# Patient Record
Sex: Male | Born: 1990 | Hispanic: Yes | Marital: Single | Smoking: Current every day smoker
Health system: Southern US, Community
[De-identification: ages and names within clinical notes are randomized; demographics above are authoritative.]

## PROBLEM LIST (undated history)

## (undated) DIAGNOSIS — F209 Schizophrenia, unspecified: Secondary | ICD-10-CM

---
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
## 2011-04-05 LAB — DIFFERENTIAL
Eosinophils Relative: 2
Lymphocytes Relative: 35
Lymphs Abs: 3
Monocytes Absolute: 0.6

## 2011-04-05 LAB — ETHANOL

## 2011-04-05 LAB — TRICYCLICS SCREEN, URINE

## 2011-04-05 LAB — URINALYSIS, ROUTINE W REFLEX MICROSCOPIC
Hgb urine dipstick: NEGATIVE
Ketones, ur: 15 — AB
Protein, ur: NEGATIVE
Urobilinogen, UA: 0.2

## 2011-04-05 LAB — URINE CULTURE
Colony Count: NO GROWTH
Culture: NO GROWTH

## 2011-04-05 LAB — CBC
HCT: 44.6 — ABNORMAL HIGH
Hemoglobin: 15.4 — ABNORMAL HIGH
RBC: 5.3 — ABNORMAL HIGH
WBC: 8.5

## 2011-04-05 LAB — POCT I-STAT, CHEM 8
Creatinine, Ser: 1
HCT: 47 — ABNORMAL HIGH
Hemoglobin: 16 — ABNORMAL HIGH
Potassium: 3.9
Sodium: 140
TCO2: 26

## 2011-04-05 LAB — RAPID URINE DRUG SCREEN, HOSP PERFORMED
Opiates: NOT DETECTED
Tetrahydrocannabinol: POSITIVE — AB

## 2011-04-12 LAB — COMPREHENSIVE METABOLIC PANEL
ALT: 10 U/L (ref 0–53)
AST: 11 U/L (ref 0–37)
Albumin: 4.1 g/dL (ref 3.5–5.2)
Alkaline Phosphatase: 133 U/L (ref 52–171)
BUN: 10 mg/dL (ref 6–23)
CO2: 29 mEq/L (ref 19–32)
Chloride: 104 mEq/L (ref 96–112)
Chloride: 106 mEq/L (ref 96–112)
Creatinine, Ser: 1 mg/dL (ref 0.4–1.5)
Glucose, Bld: 86 mg/dL (ref 70–99)
Potassium: 3.7 mEq/L (ref 3.5–5.1)
Potassium: 3.9 mEq/L (ref 3.5–5.1)
Sodium: 140 mEq/L (ref 135–145)
Sodium: 141 mEq/L (ref 135–145)
Total Bilirubin: 0.6 mg/dL (ref 0.3–1.2)
Total Bilirubin: 0.8 mg/dL (ref 0.3–1.2)

## 2011-04-12 LAB — URINALYSIS, MICROSCOPIC ONLY
Bilirubin Urine: NEGATIVE
Glucose, UA: NEGATIVE mg/dL
Hgb urine dipstick: NEGATIVE
Ketones, ur: NEGATIVE mg/dL
Protein, ur: NEGATIVE mg/dL

## 2011-04-12 LAB — LIPID PANEL
Cholesterol: 142 mg/dL (ref 0–169)
HDL: 45 mg/dL (ref >34)
Total CHOL/HDL Ratio: 3.2 RATIO

## 2011-04-12 LAB — HEPATIC FUNCTION PANEL
ALT: 9 U/L (ref 0–53)
Albumin: 4.1 g/dL (ref 3.5–5.2)
Alkaline Phosphatase: 133 U/L (ref 52–171)
Total Bilirubin: 0.5 mg/dL (ref 0.3–1.2)
Total Protein: 7.2 g/dL (ref 6.0–8.3)

## 2011-04-12 LAB — DRUGS OF ABUSE SCREEN W/O ALC, ROUTINE URINE
Barbiturate Quant, Ur: NEGATIVE
Cocaine Metabolites: NEGATIVE
Methadone: NEGATIVE
Phencyclidine (PCP): NEGATIVE

## 2011-04-12 LAB — T4, FREE: Free T4: 1.06 ng/dL (ref 0.89–1.80)

## 2011-04-12 LAB — DIFFERENTIAL
Basophils Relative: 0 % (ref 0–1)
Eosinophils Absolute: 0.2 10*3/uL (ref 0.0–1.2)
Eosinophils Relative: 2 % (ref 0–5)
Neutrophils Relative %: 73 % — ABNORMAL HIGH (ref 43–71)

## 2011-04-12 LAB — CBC
Hemoglobin: 15.4 g/dL (ref 12.0–16.0)
MCHC: 33.3 g/dL (ref 31.0–37.0)
RBC: 5.37 MIL/uL (ref 3.80–5.70)
WBC: 7.8 10*3/uL (ref 4.5–13.5)

## 2011-04-12 LAB — GC/CHLAMYDIA PROBE AMP, URINE: Chlamydia, Swab/Urine, PCR: NEGATIVE

## 2011-04-12 LAB — GAMMA GT: GGT: 16 U/L (ref 7–51)

## 2011-04-15 LAB — COMPREHENSIVE METABOLIC PANEL
ALT: 9 U/L (ref 0–53)
AST: 11 U/L (ref 0–37)
Albumin: 3.7 g/dL (ref 3.5–5.2)
CO2: 24 mEq/L (ref 19–32)
Calcium: 9.4 mg/dL (ref 8.4–10.5)
Sodium: 138 mEq/L (ref 135–145)
Total Protein: 6.4 g/dL (ref 6.0–8.3)

## 2011-04-15 LAB — URINALYSIS, ROUTINE W REFLEX MICROSCOPIC
Bilirubin Urine: NEGATIVE
Glucose, UA: NEGATIVE mg/dL
Ketones, ur: NEGATIVE mg/dL
pH: 6 (ref 5.0–8.0)

## 2011-04-15 LAB — DIFFERENTIAL
Eosinophils Absolute: 0.2 10*3/uL (ref 0.0–1.2)
Eosinophils Relative: 3 % (ref 0–5)
Lymphs Abs: 2.1 10*3/uL (ref 1.1–4.8)
Monocytes Absolute: 0.5 10*3/uL (ref 0.2–1.2)
Monocytes Relative: 8 % (ref 3–11)

## 2011-04-15 LAB — CBC
MCHC: 33.9 g/dL (ref 31.0–37.0)
RBC: 4.9 MIL/uL (ref 3.80–5.70)
RDW: 13.6 % (ref 11.4–15.5)

## 2014-09-09 ENCOUNTER — Emergency Department (HOSPITAL_COMMUNITY)
Admission: EM | Admit: 2014-09-09 | Discharge: 2014-09-09 | Disposition: A | Discharge status: Home / Self Care | Payer: Medicaid Other | Source: Home / Self Care | Attending: Emergency Medicine | Admitting: Emergency Medicine

## 2014-09-09 ENCOUNTER — Encounter (HOSPITAL_COMMUNITY): Payer: Self-pay | Admitting: *Deleted

## 2014-09-09 ENCOUNTER — Emergency Department (HOSPITAL_COMMUNITY)
Admission: EM | Admit: 2014-09-09 | Discharge: 2014-09-09 | Disposition: A | Discharge status: Home / Self Care | Payer: Medicaid Other | Attending: Emergency Medicine | Admitting: Emergency Medicine

## 2014-09-09 DIAGNOSIS — Z720 Tobacco use: Secondary | ICD-10-CM | POA: Insufficient documentation

## 2014-09-09 DIAGNOSIS — H6091 Unspecified otitis externa, right ear: Secondary | ICD-10-CM

## 2014-09-09 DIAGNOSIS — H6691 Otitis media, unspecified, right ear: Secondary | ICD-10-CM | POA: Insufficient documentation

## 2014-09-09 DIAGNOSIS — F209 Schizophrenia, unspecified: Secondary | ICD-10-CM | POA: Insufficient documentation

## 2014-09-09 DIAGNOSIS — Z79899 Other long term (current) drug therapy: Secondary | ICD-10-CM

## 2014-09-09 DIAGNOSIS — H6121 Impacted cerumen, right ear: Secondary | ICD-10-CM | POA: Diagnosis not present

## 2014-09-09 DIAGNOSIS — H9201 Otalgia, right ear: Secondary | ICD-10-CM | POA: Diagnosis present

## 2014-09-09 HISTORY — DX: Schizophrenia, unspecified: F20.9

## 2014-09-09 MED ORDER — NAPROXEN 500 MG PO TABS: 500.0000 mg | ORAL_TABLET | Freq: Two times a day (BID) | ORAL | Status: AC

## 2014-09-09 MED ORDER — AMOXICILLIN 500 MG PO CAPS: 500.0000 mg | ORAL_CAPSULE | Freq: Three times a day (TID) | ORAL | Status: DC

## 2014-09-09 MED ORDER — NEOMYCIN-POLYMYXIN-HC 3.5-10000-1 OT SUSP: 4.0000 [drp] | Freq: Three times a day (TID) | OTIC | Status: AC

## 2014-09-09 MED ORDER — TRAMADOL HCL 50 MG PO TABS: 50.0000 mg | ORAL_TABLET | Freq: Four times a day (QID) | ORAL | Status: AC | PRN

## 2014-09-09 NOTE — ED Notes (Signed)
Right ear irrigated with relief

## 2014-09-09 NOTE — ED Notes (Signed)
Pt reports seen last night for same, had wax removed. States went home and pain got worse in right ear.

## 2014-09-09 NOTE — ED Notes (Signed)
Pt reports R ear pain and drainage since Saturday.

## 2014-09-09 NOTE — Discharge Instructions (Signed)
Otitis Media Otitis media is redness, soreness, and inflammation of the middle ear. Otitis media may be caused by allergies or, most commonly, by infection. Often it occurs as a complication of the common cold. SIGNS AND SYMPTOMS Symptoms of otitis media may include:  Earache.  Fever.  Ringing in your ear.  Headache.  Leakage of fluid from the ear. DIAGNOSIS To diagnose otitis media, your health care provider will examine your ear with an otoscope. This is an instrument that allows your health care provider to see into your ear in order to examine your eardrum. Your health care provider also will ask you questions about your symptoms. TREATMENT  Typically, otitis media resolves on its own within 3-5 days. Your health care provider may prescribe medicine to ease your symptoms of pain. If otitis media does not resolve within 5 days or is recurrent, your health care provider may prescribe antibiotic medicines if he or she suspects that a bacterial infection is the cause. HOME CARE INSTRUCTIONS   If you were prescribed an antibiotic medicine, finish it all even if you start to feel better.  Take medicines only as directed by your health care provider.  Keep all follow-up visits as directed by your health care provider. SEEK MEDICAL CARE IF:  You have otitis media only in one ear, or bleeding from your nose, or both.  You notice a lump on your neck.  You are not getting better in 3-5 days.  You feel worse instead of better. SEEK IMMEDIATE MEDICAL CARE IF:   You have pain that is not controlled with medicine.  You have swelling, redness, or pain around your ear or stiffness in your neck.  You notice that part of your face is paralyzed.  You notice that the bone behind your ear (mastoid) is tender when you touch it. MAKE SURE YOU:   Understand these instructions.  Will watch your condition.  Will get help right away if you are not doing well or get worse. Document Released:  04/01/2004 Document Revised: 11/11/2013 Document Reviewed: 01/22/2013 Exit[employer] Patient Information 2015 [employer]. This information is not intended to replace advice given to you by your health care provider. Make sure you discuss any questions you have with your health care provider. Otitis Externa Otitis externa is a bacterial or fungal infection of the outer ear canal. This is the area from the eardrum to the outside of the ear. Otitis externa is sometimes called "swimmer's ear." CAUSES  Possible causes of infection include:  Swimming in dirty water.  Moisture remaining in the ear after swimming or bathing.  Mild injury (trauma) to the ear.  Objects stuck in the ear (foreign body).  Cuts or scrapes (abrasions) on the outside of the ear. SIGNS AND SYMPTOMS  The first symptom of infection is often itching in the ear canal. Later signs and symptoms may include swelling and redness of the ear canal, ear pain, and yellowish-white fluid (pus) coming from the ear. The ear pain may be worse when pulling on the earlobe. DIAGNOSIS  Your health care provider will perform a physical exam. A sample of fluid may be taken from the ear and examined for bacteria or fungi. TREATMENT  Antibiotic ear drops are often given for 10 to 14 days. Treatment may also include pain medicine or corticosteroids to reduce itching and swelling. HOME CARE INSTRUCTIONS   Apply antibiotic ear drops to the ear canal as prescribed by your health care provider.  Take medicines only as directed by   your health care provider.  If you have diabetes, follow any additional treatment instructions from your health care provider.  Keep all follow-up visits as directed by your health care provider. PREVENTION   Keep your ear dry. Use the corner of a towel to absorb water out of the ear canal after swimming or bathing.  Avoid scratching or putting objects inside your ear. This can damage the ear canal or remove the  protective wax that lines the canal. This makes it easier for bacteria and fungi to grow.  Avoid swimming in lakes, polluted water, or poorly chlorinated pools.  You may use ear drops made of rubbing alcohol and vinegar after swimming. Combine equal parts of white vinegar and alcohol in a bottle. Put 3 or 4 drops into each ear after swimming. SEEK MEDICAL CARE IF:   You have a fever.  Your ear is still red, swollen, painful, or draining pus after 3 days.  Your redness, swelling, or pain gets worse.  You have a severe headache.  You have redness, swelling, pain, or tenderness in the area behind your ear. MAKE SURE YOU:   Understand these instructions.  Will watch your condition.  Will get help right away if you are not doing well or get worse. Document Released: 06/27/2005 Document Revised: 11/11/2013 Document Reviewed: 07/14/2011 Exit[employer] Patient Information 2015 [employer]. This information is not intended to replace advice given to you by your health care provider. Make sure you discuss any questions you have with your health care provider.  

## 2014-09-09 NOTE — Discharge Instructions (Signed)
Cerumen Impaction °A cerumen impaction is when the wax in your ear forms a plug. This plug usually causes reduced hearing. Sometimes it also causes an earache or dizziness. Removing a cerumen impaction can be difficult and painful. The wax sticks to the ear canal. The canal is sensitive and bleeds easily. If you try to remove a heavy wax buildup with a cotton tipped swab, you may push it in further. °Irrigation with water, suction, and small ear curettes may be used to clear out the wax. If the impaction is fixed to the skin in the ear canal, ear drops may be needed for a few days to loosen the wax. People who build up a lot of wax frequently can use ear wax removal products available in your local drugstore. °SEEK MEDICAL CARE IF:  °You develop an earache, increased hearing loss, or marked dizziness. °Document Released: 08/04/2004 Document Revised: 09/19/2011 Document Reviewed: 09/24/2009 °Exit[employer]® Patient Information ©2015 [employer]. This information is not intended to replace advice given to you by your health care provider. Make sure you discuss any questions you have with your health care provider. ° °

## 2014-09-09 NOTE — ED Provider Notes (Signed)
CSN: [id number]     Arrival date & time 09/09/14  0127 History   First MD Initiated Contact with Patient 09/09/14 [id number]     Chief Complaint  Patient presents with  . Otalgia     (Consider location/radiation/quality/duration/timing/severity/associated sxs/prior Treatment) Patient is a 24 y.o. male presenting with ear pain. The history is provided by the patient. No language interpreter was used.  Otalgia Location:  Right Associated symptoms: ear discharge and hearing loss   Associated symptoms: no congestion and no fever     Past Medical History  Diagnosis Date  . Schizophrenia    History reviewed. No pertinent past surgical history. No family history on file. History  Substance Use Topics  . Smoking status: Current Every Day Smoker -- 0.25 packs/day    Types: Cigarettes  . Smokeless tobacco: Not on file  . Alcohol Use: No    Review of Systems  Constitutional: Negative for fever.  HENT: Positive for ear discharge, ear pain and hearing loss. Negative for congestion.   Gastrointestinal: Negative for nausea.  Neurological: Negative for dizziness.      Allergies  Review of patient's allergies indicates no known allergies.  Home Medications   Prior to Admission medications   Medication Sig Start Date End Date Taking? Authorizing Provider  escitalopram (LEXAPRO) 10 MG tablet Take 10 mg by mouth daily.   Yes Historical Provider, MD  QUEtiapine (SEROQUEL) 100 MG tablet Take 100 mg by mouth at bedtime.   Yes Historical Provider, MD  QUEtiapine (SEROQUEL) 200 MG tablet Take 200 mg by mouth at bedtime.   Yes Historical Provider, MD  risperiDONE (RISPERDAL) 2 MG tablet Take 2 mg by mouth 2 (two) times daily.   Yes Historical Provider, MD   BP 142/69 mmHg  Pulse 101  Temp(Src) 98.2 F (36.8 C) (Oral)  Resp 18  Ht 5\' 5"  (1.651 m)  Wt 145 lb (65.772 kg)  BMI 24.13 kg/m2  SpO2 100% Physical Exam  Constitutional: He is oriented to person, place, and time. He appears  well-developed and well-nourished.  HENT:  Right external canal impacted with cerumen. No bleeding or purulent drainage.   Neck: Normal range of motion.  Pulmonary/Chest: Effort normal.  Musculoskeletal: Normal range of motion.  Neurological: He is alert and oriented to person, place, and time.  Skin: Skin is warm and dry.  Psychiatric: He has a normal mood and affect.    ED Course  Procedures (including critical care time) Labs Review Labs Reviewed - No data to display  Imaging Review No results found.   EKG Interpretation None      MDM   Final diagnoses:  None    1. Cerumen impaction, resolved  Easy clearing of impaction with lavage.     Arnoldo HookerShari A Fillmore Bynum, PA-C 09/09/14 0422  Tomasita CrumbleAdeleke Oni, MD 09/09/14 [phone]

## 2014-09-09 NOTE — ED Provider Notes (Signed)
TIME SEEN: 9:35 AM  CHIEF COMPLAINT: Right ear pain  HPI: Pt is a 24 y.o. male with history of schizophrenia who presents emergency department with right ear pain. Was seen last night and had cerumen impaction which was removed with lavage. States his pain progressively worsened. Denies any fevers. No hearing loss. No tinnitus. No ear drainage.  ROS: See HPI Constitutional: no fever  Eyes: no drainage  ENT: no runny nose   Cardiovascular:  no chest pain  Resp: no SOB  GI: no vomiting GU: no dysuria Integumentary: no rash  Allergy: no hives  Musculoskeletal: no leg swelling  Neurological: no slurred speech ROS otherwise negative  PAST MEDICAL HISTORY/PAST SURGICAL HISTORY:  Past Medical History  Diagnosis Date  . Schizophrenia     MEDICATIONS:  Prior to Admission medications   Medication Sig Start Date End Date Taking? Authorizing Provider  amoxicillin (AMOXIL) 500 MG capsule Take 1 capsule (500 mg total) by mouth 3 (three) times daily. 09/09/14   Kristen N Ward, DO  escitalopram (LEXAPRO) 10 MG tablet Take 10 mg by mouth daily.    Historical Provider, MD  naproxen (NAPROSYN) 500 MG tablet Take 1 tablet (500 mg total) by mouth 2 (two) times daily. 09/09/14   Kristen N Ward, DO  neomycin-polymyxin-hydrocortisone (CORTISPORIN) 3.5-10000-1 otic suspension Place 4 drops into the right ear 3 (three) times daily. For one week 09/09/14   Layla MawKristen N Ward, DO  QUEtiapine (SEROQUEL) 100 MG tablet Take 100 mg by mouth at bedtime.    Historical Provider, MD  QUEtiapine (SEROQUEL) 200 MG tablet Take 200 mg by mouth at bedtime.    Historical Provider, MD  risperiDONE (RISPERDAL) 2 MG tablet Take 2 mg by mouth 2 (two) times daily.    Historical Provider, MD  traMADol (ULTRAM) 50 MG tablet Take 1 tablet (50 mg total) by mouth every 6 (six) hours as needed. 09/09/14   Kristen N Ward, DO    ALLERGIES:  No Known Allergies  SOCIAL HISTORY:  History  Substance Use Topics  . Smoking status: Current  Every Day Smoker -- 0.25 packs/day    Types: Cigarettes  . Smokeless tobacco: Not on file  . Alcohol Use: No    FAMILY HISTORY: No family history on file.  EXAM: BP 115/57 mmHg  Pulse 79  Temp(Src) 98.2 F (36.8 C) (Oral)  Resp 16  Ht 5\' 5"  (1.651 m)  Wt 145 lb (65.772 kg)  BMI 24.13 kg/m2  SpO2 99% CONSTITUTIONAL: Alert and oriented and responds appropriately to questions. Well-appearing; well-nourished HEAD: Normocephalic EYES: Conjunctivae clear, PERRL ENT: normal nose; no rhinorrhea; moist mucous membranes; pharynx without lesions noted, left TM is clear without purulence or erythema, right TM appears purulent, bulging, erythematous. There is also inflammation and erythema of the canal, no impaction, no drainage, no sign of mastoiditis  NECK: Supple, no meningismus, no LAD  CARD: RRR; S1 and S2 appreciated; no murmurs, no clicks, no rubs, no gallops RESP: Normal chest excursion without splinting or tachypnea; breath sounds clear and equal bilaterally; no wheezes, no rhonchi, no rales,  ABD/GI: Normal bowel sounds; non-distended; soft, non-tender, no rebound, no guarding BACK:  The back appears normal and is non-tender to palpation, there is no CVA tenderness EXT: Normal ROM in all joints; non-tender to palpation; no edema; normal capillary refill; no cyanosis    SKIN: Normal color for age and race; warm NEURO: Moves all extremities equally PSYCH: The patient's mood and manner are appropriate. Grooming and personal hygiene are appropriate.  MEDICAL DECISION MAKING: Patient here with what appears to be otitis media and otitis externa. We'll discharge home on amoxicillin and with Corticosporin drops. We'll discharge with naproxen and tramadol as needed for pain. Discussed return precautions and need for outpatient follow-up. Patient verbalizes understanding and is comfortable with plan.     Layla Maw Ward, DO 09/09/14 [phone]

## 2015-07-27 ENCOUNTER — Encounter (HOSPITAL_COMMUNITY): Payer: Self-pay | Admitting: Emergency Medicine

## 2015-07-27 ENCOUNTER — Emergency Department (HOSPITAL_COMMUNITY)
Admission: EM | Admit: 2015-07-27 | Discharge: 2015-07-27 | Disposition: A | Discharge status: Home / Self Care | Payer: Medicaid Other | Attending: Emergency Medicine | Admitting: Emergency Medicine

## 2015-07-27 DIAGNOSIS — Z791 Long term (current) use of non-steroidal anti-inflammatories (NSAID): Secondary | ICD-10-CM | POA: Insufficient documentation

## 2015-07-27 DIAGNOSIS — S01512A Laceration without foreign body of oral cavity, initial encounter: Secondary | ICD-10-CM | POA: Diagnosis not present

## 2015-07-27 DIAGNOSIS — F17210 Nicotine dependence, cigarettes, uncomplicated: Secondary | ICD-10-CM | POA: Insufficient documentation

## 2015-07-27 DIAGNOSIS — F209 Schizophrenia, unspecified: Secondary | ICD-10-CM | POA: Insufficient documentation

## 2015-07-27 DIAGNOSIS — X58XXXA Exposure to other specified factors, initial encounter: Secondary | ICD-10-CM | POA: Insufficient documentation

## 2015-07-27 DIAGNOSIS — Y998 Other external cause status: Secondary | ICD-10-CM | POA: Insufficient documentation

## 2015-07-27 DIAGNOSIS — S0993XA Unspecified injury of face, initial encounter: Secondary | ICD-10-CM | POA: Diagnosis present

## 2015-07-27 DIAGNOSIS — Y9389 Activity, other specified: Secondary | ICD-10-CM | POA: Diagnosis not present

## 2015-07-27 DIAGNOSIS — Z79899 Other long term (current) drug therapy: Secondary | ICD-10-CM | POA: Insufficient documentation

## 2015-07-27 DIAGNOSIS — Y9289 Other specified places as the place of occurrence of the external cause: Secondary | ICD-10-CM | POA: Insufficient documentation

## 2015-07-27 MED ORDER — AMOXICILLIN 500 MG PO CAPS: 500.0000 mg | ORAL_CAPSULE | Freq: Three times a day (TID) | ORAL | Status: AC

## 2015-07-27 NOTE — Discharge Instructions (Signed)
°Emergency Department Resource Guide °1) Find a Doctor and Pay Out of Pocket °Although you won't have to find out who is covered by your insurance plan, it is a good idea to ask around and get recommendations. You will then need to call the office and see if the doctor you have chosen will accept you as a new patient and what types of options they offer for patients who are self-pay. Some doctors offer discounts or will set up payment plans for their patients who do not have insurance, but you will need to ask so you aren't surprised when you get to your appointment. ° °2) Contact Your Local Health Department °Not all health departments have doctors that can see patients for sick visits, but many do, so it is worth a call to see if yours does. If you don't know where your local health department is, you can check in your phone book. The CDC also has a tool to help you locate your state's health department, and many state websites also have listings of all of their local health departments. ° °3) Find a Walk-in Clinic °If your illness is not likely to be very severe or complicated, you may want to try a walk in clinic. These are popping up all over the country in pharmacies, drugstores, and shopping centers. They're usually staffed by nurse practitioners or physician assistants that have been trained to treat common illnesses and complaints. They're usually fairly quick and inexpensive. However, if you have serious medical issues or chronic medical problems, these are probably not your best option. ° °No Primary Care Doctor: °- Call [facility] at  832-8000 - they can help you locate a primary care doctor that  accepts your insurance, provides certain services, etc. °- Physician Referral Service- 1-800-533-3463 ° °Chronic Pain Problems: °Organization         Address  Phone   Notes  °[facility]  (336) 297-2271 Patients need to be referred by their primary care doctor.  ° °Medication  Assistance: °Organization         Address  Phone   Notes  °[place] [facility] [address] °[place], [place] [address] (336) 641-8030 --Must be a resident of [place] °-- Must have NO insurance coverage whatsoever (no Medicaid/ Medicare, etc.) °-- The pt. MUST have a primary care doctor that directs their care regularly and follows them in the community °  °[facility]  (866) 331-1348   °[employer]  (888) 892-1162   ° °Agencies that provide inexpensive medical care: °Organization         Address  Phone   Notes  °[facility]  (336) 832-8035   °[facility]    (336) 832-7272   °[facility] [address] °[place], [place] [address] (336) 832-4777   °[facility] [address], °[place] (336) 271-4999   °[facility]    (336) 373-0678   °[facility]    (336) 272-1050   °[facility] ° [address], [place] Phone:  (336) 832-4444, Fax:  (336) 832-4440 Hours of Operation:  9 am - 6 pm, M-F.  Also accepts Medicaid/Medicare and self-pay.  °[facility] ° [address], [place] Phone: (336) 832-3150, Fax: (336) [phone]. Hours of Operation:  8:30 am - 5:30 pm, M-F.  Also accepts Medicaid and self-pay.  °[facility] [place] [address], [place] Phone: (336) 878-6027   °[facility] [address], [place], [place] (336)723-1848, Ext. 123 Mondays & Thursdays: 7-9 AM.  First 15 patients are seen on a first come, first serve basis. °  ° °Medicaid-accepting [place] Providers: ° °Organization         Address  Phone   Notes  °[facility] [address], [place] (336) 641-2100 Also accepts self-pay patients.  °[facility] [address], [place] ° (336) 856-9996   °[facility] [address], [place]  (336) 288-8857   °[facility] [address] [place] [address], [place] (336) 299-7000   °Veita Bland [address], [place]  ° (336) 373-1557 Only accepts [place] Access Medicaid patients after they have their name applied to their card.  ° °Self-Pay (no insurance) in [place]: ° °Organization         Address  Phone   Notes  °Sickle Cell Patients, [facility] [address], [place] (336) 832-1970   °[facility] [address], [place] (336) 832-4400   °[facility] ° [address], [place] (336) 992-4800   °[facility]/Dr. Osei-Bonsu ° [address] [place] [address], [place] or [address], [place] (336) 841-8500 Phone number for both [place] and [place] locations is the same.  °[facility] [address], [place] (336) 299-0000   °[facility] [address] [place] [address], [place] or [address] (336) 852-7530 °(336) 878-2260   °[facility] [address], [place] (336) 350-1642, phone; (336) 294-5005, fax Sees patients 1st and 3rd Saturday of every month.  Must not qualify for public or private insurance (i.e. Medicaid, Medicare, [place] Health Choice, Veterans' Benefits) • Household income should be no more than 200% of the poverty level •The clinic cannot treat you if you are pregnant or think you are pregnant • Sexually transmitted diseases are not treated at the clinic.  ° ° °Dental Care: °Organization         Address  Phone  Notes  °[place] [facility] [address], [place] (336) 641-6152 Accepts children up to age 21 who are enrolled in Medicaid or [place] Health Choice; pregnant women with a Medicaid card; and children who have applied for Medicaid or [place] Health Choice, but were declined, whose parents can pay a reduced fee at time of service.  °[place]  [facility] [place]  [address], [place] (336) 641-7733 Accepts children up to age 21 who are enrolled in Medicaid or [place] Health Choice; pregnant women with a Medicaid card; and children who have applied for Medicaid or [place] Health Choice, but were declined, whose parents can pay a reduced fee at time of service.  °[facility] ° [address], [place] (336) 641-4533 Patients are seen by appointment only. Walk-ins are not accepted. [facility] will see patients 18 years of age and older. °Monday - Tuesday (8am-5pm) °Most Wednesdays (8:30-5pm) °$30 per visit, cash only  °[facility] ° [address], [place] (336) 641-4533 Patients are seen by appointment only. Walk-ins are not accepted. [facility] will see patients 18 years of age and older. °One   Wednesday Evening (Monthly: Volunteer Based).  $30 per visit, cash only  °[facility]  (919) 537-3737 for adults; Children under age 4, call Graduate Pediatric Dentistry at (919) [phone]. Children aged 4-14, please call (919) [phone] to request a pediatric application. ° Dental services are provided in all areas of dental care including fillings, crowns and bridges, complete and partial dentures, implants, gum treatment, root canals, and extractions. Preventive care is also provided. Treatment is provided to both adults and children. °Patients are selected via a lottery and there is often a waiting list. °  °[facility] [address], °[place] ° (336) 763-8833 [email or web address] °  °[facility] [address], [place], [place] (336)723-1848, Ext. 123 Second and Fourth Thursday of each month, opens at 6:30 AM; Clinic ends at 9 AM.  Patients are seen on a first-come first-served basis, and a limited number are seen during each clinic.  ° °[facility] ° [address], [place], [place] (336) 723-7904    Eligibility Requirements °You must have lived in [place], [place], or [place] counties for at least the last three months. °  You cannot be eligible for state or federal sponsored healthcare insurance, including Veterans Administration, Medicaid, or Medicare. °  You generally cannot be eligible for healthcare insurance through your employer.  °  How to apply: °Eligibility screenings are held every Tuesday and Wednesday afternoon from 1:00 pm until 4:00 pm. You do not need an appointment for the interview!  °[facility] [address], [place], [place] 336-631-2330   °[facility]  336-342-8273   °[facility]  336-703-3100   °[facility]  336-570-6415   ° °Behavioral Health Resources in the Community: °Intensive Outpatient Programs °Organization         Address  Phone  Notes  °[facility] [address], [place], [place] 336-878-6098   °[facility] [address], [place], [place] 336-832-9800   °[facility] [address], [place], [place] ° 336-882-2125   °[facility] [address],  °[place], [place] 1-800-853-5163 or 336-641-4981   °Substance Abuse Resources °Organization         Address  Phone  Notes  °[facility]  336-882-2125   °[facility]  336-784-9470   °[facility]  336-285-9073   °[facility]  336-845-3988   °Residential & Outpatient Substance Abuse Program  1-800-659-3381   °Psychological Services °Organization         Address  Phone  Notes  °[facility]  336- 832-9600   °[facility]  336- 378-7881   °[facility] [address], [place] 1-800-853-5163 or 336-641-4981   ° °Mobile Crisis Teams °Organization         Address  Phone  Notes  °[facility], Mobile Crisis Care Unit  1-877-626-1772   °Assertive °[facility] ° [address]  [place], [place] 336-834-9664   °Sharon DeEsch [address] °[place] 336-554-5454   ° °Self-Help/Support Groups °Organization         Address  Phone             Notes  °[facility]  - variety of support groups  336- 373-1402 Call for more information  °Narcotics Anonymous (NA), [facility] [address], °[place]  2 meetings at this location  ° °  Residential Treatment Programs Organization         Address  Phone  Notes  [facility] [address],    [place]  [phone])[phone]   [facility]  [address], [place] [phone], [place], [place] [phone](770) [phone]   [facility] [address], [place] [phone] Admissions: 8am-3pm M-F  [facility] [address]    [place], [place] [phone]   [facility] [address], [place], [place] [phone]   [facility] [address]  [place], [place] [phone]   [facility] Intensive Outpatient [address], [place], [place] [phone]   [facility] ([facility] [address]  [place], [place] [phone] or [phone]   [facility] [address] [place], [place] [phone] Accepts Medicaid  [facility] [address]  [place] [phone] Substance Abuse/Addiction Treatment   [facility]         Address  Phone  Notes  [facility]  [phone](888) [phone]   Angie FavaJulie Brannon, PhD [address], [address] [place], [place]   [phone](336) [phone] or ([phone](336) ([phone]   [facility]   [address] [place], [place] [phone](336) [phone]   [facility] [address], [place], [place] ([phone](336) [phone] Insurance/Medicaid/sponsorship through [facility]  [facility] [address]                                    [place], [place] [phone](336) [phone] Therapy/tele-psych/case    [facility] [address]   [place], [place] [phone](336) [phone]    Dr. Lolly MustacheArfeen  [phone]) [phone]   [facility] [place]  [employer] [facility] 1) [address], [place] 2) [address], [place] 3)  [address], [place] [phone](336) [phone](336) [phone]  [phone](336) [phone]   [facility] 240-125-8790(336) (949)588-1228 or [phone](336) ([phone] (After Hours)       Please follow-up with the dentist as soon as possible for further evaluation and management. Please use antibiotics as directed. If any new or worsening signs or symptoms present please return to the emergency room immediately for further evaluation and management.

## 2015-07-27 NOTE — ED Notes (Signed)
Pt states he was picking at his mouth with his fingernail and it cut his mouth under his tongue  Pt states it happened about 3 hrs ago

## 2015-07-27 NOTE — ED Provider Notes (Signed)
CSN: [id number]     Arrival date & time 07/27/15  2051 History  By signing my name below, I, Marcus Meyer, attest that this documentation has been prepared under the direction and in the presence of [employer], PA-C. Electronically Signed: Phillis Meyer, ED Scribe. 07/27/2015. 11:00 PM.   Chief Complaint  Patient presents with  . Mouth Injury   The history is provided by the patient. No language interpreter was used.  HPI Comments: Marcus Meyer is a 25 y.o. male who presents to the Emergency Department complaining of a mouth injury onset 3 hours ago. Pt states that he was picking at his mouth with his fingernails when he cut the area underneath his tongue. He is reporting gradually worsening pain to the area. He denies fever, chills, nausea, or vomiting.   Past Medical History  Diagnosis Date  . Schizophrenia (HCC)    History reviewed. No pertinent past surgical history. History reviewed. No pertinent family history. Social History  Substance Use Topics  . Smoking status: Current Every Day Smoker -- 0.25 packs/day    Types: Cigarettes  . Smokeless tobacco: None  . Alcohol Use: No    Review of Systems  All other systems reviewed and are negative.  Allergies  Review of patient's allergies indicates no known allergies.  Home Medications   Prior to Admission medications   Medication Sig Start Date End Date Taking? Authorizing Provider  amoxicillin (AMOXIL) 500 MG capsule Take 1 capsule (500 mg total) by mouth 3 (three) times daily. 07/27/15   Eyvonne Mechanic, PA-C  escitalopram (LEXAPRO) 10 MG tablet Take 10 mg by mouth daily.    Historical Provider, MD  naproxen (NAPROSYN) 500 MG tablet Take 1 tablet (500 mg total) by mouth 2 (two) times daily. 09/09/14   Kristen N Ward, DO  neomycin-polymyxin-hydrocortisone (CORTISPORIN) 3.5-10000-1 otic suspension Place 4 drops into the right ear 3 (three) times daily. For one week 09/09/14   Layla Maw Ward, DO  QUEtiapine (SEROQUEL) 100 MG  tablet Take 100 mg by mouth at bedtime.    Historical Provider, MD  QUEtiapine (SEROQUEL) 200 MG tablet Take 200 mg by mouth at bedtime.    Historical Provider, MD  risperiDONE (RISPERDAL) 2 MG tablet Take 2 mg by mouth 2 (two) times daily.    Historical Provider, MD  traMADol (ULTRAM) 50 MG tablet Take 1 tablet (50 mg total) by mouth every 6 (six) hours as needed. 09/09/14   Kristen N Ward, DO   BP 124/68 mmHg  Pulse 58  Temp(Src) 98.3 F (36.8 C) (Oral)  Resp 14  Ht 5\' 4"  (1.626 m)  Wt 65.772 kg  BMI 24.88 kg/m2  SpO2 97% Physical Exam  Constitutional: He is oriented to person, place, and time. He appears well-developed and well-nourished.  HENT:  Head: Normocephalic.  Mouth/Throat: Uvula is midline, oropharynx is clear and moist and mucous membranes are normal. No oropharyngeal exudate.  0.5 cm laceration to the floor of the mouth on the right anterior aspect that does not involve the sublingual glands or frenulum.   Eyes: EOM are normal. Pupils are equal, round, and reactive to light.  Neck: Normal range of motion. Neck supple.  Cardiovascular: Normal rate and regular rhythm.   Pulmonary/Chest: Effort normal and breath sounds normal.  Abdominal: Soft. There is no tenderness.  Musculoskeletal: Normal range of motion.  Neurological: He is alert and oriented to person, place, and time.  Skin: Skin is warm and dry.  Psychiatric: He has a normal mood and  affect. His behavior is normal.  Nursing note and vitals reviewed.   ED Course  Procedures (including critical care time) DIAGNOSTIC STUDIES: Oxygen Saturation is 97% on RA, normal by my interpretation.    COORDINATION OF CARE:    Labs Review Labs Reviewed - No data to display  Imaging Review No results found. I have personally reviewed and evaluated these images and lab results as part of my medical decision-making.   EKG Interpretation None      MDM   Final diagnoses:  Mouth injury, initial encounter    Labs:  Imaging:  Consults:  Therapeutics:  Discharge Meds:   Assessment/Plan: Patient is a very minor tear through the floor of the mouth. No signs of infection. This is anterior and has no involvement of the submandibular glands. There is no free flap on the jaw side that would allow for reapproximation, I do not feel is necessary to attempt reapproximation as patient has a very small tear, with no significant glandular involvement. He'll be instructed to follow-up with the dentist, he was given resources, sugar term percussions. I did place 1 prophylactic antibiotics. He is given strict return precautions, verbalized understanding and agreement to today's plan and had no further questions or concerns    I personally performed the services described in this documentation, which was scribed in my presence. The recorded information has been reviewed and is accurate.    Eyvonne MechanicJeffrey Loney Peto, PA-C 07/29/15 0505  Lavera Guiseana Duo Liu, MD 07/29/15 2019

## 2015-07-27 NOTE — ED Notes (Signed)
Patient was alert, oriented and stable upon discharge. RN went over AVS and patient had no further questions.  

## 2020-03-22 ENCOUNTER — Encounter (HOSPITAL_COMMUNITY): Payer: Self-pay | Admitting: Emergency Medicine

## 2020-03-22 ENCOUNTER — Emergency Department (HOSPITAL_COMMUNITY): Payer: Medicaid Other

## 2020-03-22 ENCOUNTER — Emergency Department (HOSPITAL_COMMUNITY)
Admission: EM | Admit: 2020-03-22 | Discharge: 2020-03-22 | Disposition: A | Discharge status: Home / Self Care | Payer: Medicaid Other | Attending: Emergency Medicine | Admitting: Emergency Medicine

## 2020-03-22 ENCOUNTER — Other Ambulatory Visit: Payer: Self-pay

## 2020-03-22 DIAGNOSIS — R519 Headache, unspecified: Secondary | ICD-10-CM | POA: Insufficient documentation

## 2020-03-22 DIAGNOSIS — Z79899 Other long term (current) drug therapy: Secondary | ICD-10-CM | POA: Insufficient documentation

## 2020-03-22 DIAGNOSIS — R079 Chest pain, unspecified: Secondary | ICD-10-CM | POA: Insufficient documentation

## 2020-03-22 DIAGNOSIS — Y939 Activity, unspecified: Secondary | ICD-10-CM | POA: Diagnosis not present

## 2020-03-22 DIAGNOSIS — Y92410 Unspecified street and highway as the place of occurrence of the external cause: Secondary | ICD-10-CM | POA: Insufficient documentation

## 2020-03-22 DIAGNOSIS — F17210 Nicotine dependence, cigarettes, uncomplicated: Secondary | ICD-10-CM | POA: Insufficient documentation

## 2020-03-22 DIAGNOSIS — S301XXA Contusion of abdominal wall, initial encounter: Secondary | ICD-10-CM | POA: Insufficient documentation

## 2020-03-22 DIAGNOSIS — Y999 Unspecified external cause status: Secondary | ICD-10-CM | POA: Insufficient documentation

## 2020-03-22 LAB — URINALYSIS, ROUTINE W REFLEX MICROSCOPIC
Bilirubin Urine: NEGATIVE
Glucose, UA: NEGATIVE mg/dL
Hgb urine dipstick: NEGATIVE
Ketones, ur: NEGATIVE mg/dL
Leukocytes,Ua: NEGATIVE
Nitrite: NEGATIVE
Protein, ur: NEGATIVE mg/dL
Specific Gravity, Urine: 1.003 — ABNORMAL LOW (ref 1.005–1.030)
pH: 6 (ref 5.0–8.0)

## 2020-03-22 LAB — COMPREHENSIVE METABOLIC PANEL
ALT: 39 U/L (ref 0–44)
AST: 42 U/L — ABNORMAL HIGH (ref 15–41)
Albumin: 4 g/dL (ref 3.5–5.0)
Alkaline Phosphatase: 102 U/L (ref 38–126)
Anion gap: 10 (ref 5–15)
BUN: 8 mg/dL (ref 6–20)
CO2: 26 mmol/L (ref 22–32)
Calcium: 8.9 mg/dL (ref 8.9–10.3)
Chloride: 104 mmol/L (ref 98–111)
Creatinine, Ser: 0.71 mg/dL (ref 0.61–1.24)
GFR calc Af Amer: >60 mL/min (ref >60)
GFR calc non Af Amer: >60 mL/min (ref >60)
Glucose, Bld: 95 mg/dL (ref 70–99)
Potassium: 4 mmol/L (ref 3.5–5.1)
Sodium: 140 mmol/L (ref 135–145)
Total Bilirubin: 0.4 mg/dL (ref 0.3–1.2)
Total Protein: 7.5 g/dL (ref 6.5–8.1)

## 2020-03-22 LAB — CBC WITH DIFFERENTIAL/PLATELET
Abs Immature Granulocytes: 0.03 10*3/uL (ref 0.00–0.07)
Basophils Absolute: 0.1 10*3/uL (ref 0.0–0.1)
Basophils Relative: 1 %
Eosinophils Absolute: 0.2 10*3/uL (ref 0.0–0.5)
Eosinophils Relative: 3 %
HCT: 41 % (ref 39.0–52.0)
Hemoglobin: 13.6 g/dL (ref 13.0–17.0)
Immature Granulocytes: 0 %
Lymphocytes Relative: 22 %
Lymphs Abs: 2 10*3/uL (ref 0.7–4.0)
MCH: 30.5 pg (ref 26.0–34.0)
MCHC: 33.2 g/dL (ref 30.0–36.0)
MCV: 91.9 fL (ref 80.0–100.0)
Monocytes Absolute: 0.8 10*3/uL (ref 0.1–1.0)
Monocytes Relative: 9 %
Neutro Abs: 6 10*3/uL (ref 1.7–7.7)
Neutrophils Relative %: 65 %
Platelets: 215 10*3/uL (ref 150–400)
RBC: 4.46 MIL/uL (ref 4.22–5.81)
RDW: 14.4 % (ref 11.5–15.5)
WBC: 9.1 10*3/uL (ref 4.0–10.5)
nRBC: 0 % (ref 0.0–0.2)

## 2020-03-22 LAB — LIPASE, BLOOD: Lipase: 44 U/L (ref 11–51)

## 2020-03-22 LAB — I-STAT CREATININE, ED: Creatinine, Ser: 0.8 mg/dL (ref 0.61–1.24)

## 2020-03-22 MED ORDER — IOHEXOL 300 MG/ML  SOLN
100.0000 mL | Freq: Once | INTRAMUSCULAR | Status: AC | PRN
  Administered 2020-03-22: 100 mL via INTRAVENOUS

## 2020-03-22 MED ORDER — OXYCODONE-ACETAMINOPHEN 5-325 MG PO TABS
1.0000 | ORAL_TABLET | Freq: Once | ORAL | Status: AC
  Administered 2020-03-22: 1 via ORAL
  Filled 2020-03-22: qty 1

## 2020-03-22 MED ORDER — CYCLOBENZAPRINE HCL 10 MG PO TABS: 10.0000 mg | ORAL_TABLET | Freq: Two times a day (BID) | ORAL | 0 refills | Status: AC | PRN

## 2020-03-22 MED ORDER — IBUPROFEN 600 MG PO TABS: 600.0000 mg | ORAL_TABLET | Freq: Three times a day (TID) | ORAL | 0 refills | Status: AC | PRN
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
